# Patient Record
Sex: Female | Born: 1975 | Race: White | Hispanic: No | Marital: Married
Health system: Southern US, Community
[De-identification: ages and names within clinical notes are randomized; demographics above are authoritative.]

## PROBLEM LIST (undated history)

## (undated) DIAGNOSIS — I10 Essential (primary) hypertension: Secondary | ICD-10-CM

## (undated) DIAGNOSIS — E7800 Pure hypercholesterolemia, unspecified: Secondary | ICD-10-CM

## (undated) DIAGNOSIS — IMO0002 Reserved for concepts with insufficient information to code with codable children: Secondary | ICD-10-CM

## (undated) HISTORY — PX: BREAST EXCISIONAL BIOPSY: SUR124

---

## 2004-12-05 ENCOUNTER — Ambulatory Visit: Payer: Self-pay | Admitting: Internal Medicine

## 2007-07-29 ENCOUNTER — Emergency Department: Payer: Self-pay | Admitting: Emergency Medicine

## 2011-01-23 HISTORY — PX: BREAST CYST EXCISION: SHX579

## 2011-03-26 ENCOUNTER — Ambulatory Visit: Payer: Self-pay | Admitting: Internal Medicine

## 2011-05-08 ENCOUNTER — Ambulatory Visit: Payer: Self-pay | Admitting: Anesthesiology

## 2011-05-10 ENCOUNTER — Ambulatory Visit: Payer: Self-pay | Admitting: Emergency Medicine

## 2011-05-10 LAB — PREGNANCY, URINE: Pregnancy Test, Urine: NEGATIVE m[IU]/mL

## 2014-05-07 ENCOUNTER — Other Ambulatory Visit: Payer: Self-pay | Admitting: Internal Medicine

## 2014-05-07 DIAGNOSIS — Z1231 Encounter for screening mammogram for malignant neoplasm of breast: Secondary | ICD-10-CM

## 2014-05-16 NOTE — Op Note (Signed)
PATIENT NAME:  Erica Golden, Damonie A MR#:  [id number] DATE OF BIRTH:  [birth year]  DATE OF PROCEDURE:  05/10/2011  PREOPERATIVE DIAGNOSIS:  Mass in the right breast.  POSTOPERATIVE DIAGNOSIS:  Mass in the right breast.  PROCEDURE: Excision of the mass from the right breast.   HISTORY: This patient was seen by me in my office because of the mass in the right breast. Mammogram really did not reveal anything very revealing, but on examination I felt a mass at about the 10 o'clock position. It felt very hard, although it could be fibrocystic disease, but I did not like this. I told her that we need to have this thing removed.   PROCEDURE: The patient was then brought to surgery. Under general anesthesia, the right breast was then prepped and draped. An incision was made over the right breast. After cutting skin and subcutaneous tissue, there were actually two masses stuck together. This was then grasped with Allis clamp. Dissection was done all around. During the dissection I found multiple areas of cystic masses and a lot of cystic fluid so it could be fibrocystic disease, but this felt very hard and could be a carcinoma or fat necrosis. After removing all that area, the wound was then closed in layers. Bleeding was stopped with the Bovie. Subcuticular closure was done with 4-0 Vicryl and Dermabond was applied on the skin. The patient tolerated the procedure well and was sent to the recovery room in satisfactory condition.    ____________________________ Alton RevereMasud S. Cecelia ByarsHashmi, MD [id number] D: 05/10/2011 08:21:48 ET T: 05/10/2011 11:43:11 ET JOB#: [id number]  cc: Rydan Gulyas S. Cecelia ByarsHashmi, MD, <Dictator> Margaretann LovelessNeelam S. Khan, MD Meryle ReadyMASUD S Aloha Bartok MD ELECTRONICALLY SIGNED 05/15/2011 8:36

## 2014-08-19 ENCOUNTER — Ambulatory Visit
Admission: RE | Admit: 2014-08-19 | Discharge: 2014-08-19 | Disposition: A | Discharge status: Home / Self Care | Payer: BLUE CROSS/BLUE SHIELD | Source: Ambulatory Visit | Attending: Internal Medicine | Admitting: Internal Medicine

## 2014-08-19 DIAGNOSIS — Z1231 Encounter for screening mammogram for malignant neoplasm of breast: Secondary | ICD-10-CM | POA: Insufficient documentation

## 2014-09-14 ENCOUNTER — Encounter: Payer: Self-pay | Admitting: *Deleted

## 2014-09-15 ENCOUNTER — Ambulatory Visit
Admission: RE | Admit: 2014-09-15 | Payer: BLUE CROSS/BLUE SHIELD | Source: Ambulatory Visit | Admitting: Unknown Physician Specialty

## 2014-09-15 ENCOUNTER — Encounter: Admission: RE | Payer: Self-pay | Source: Ambulatory Visit

## 2014-09-15 HISTORY — DX: Reserved for concepts with insufficient information to code with codable children: IMO0002

## 2014-09-15 HISTORY — DX: Pure hypercholesterolemia, unspecified: E78.00

## 2014-09-15 HISTORY — DX: Essential (primary) hypertension: I10

## 2014-09-15 SURGERY — COLONOSCOPY WITH PROPOFOL
Anesthesia: General

## 2016-01-24 ENCOUNTER — Other Ambulatory Visit: Payer: Self-pay | Admitting: Internal Medicine

## 2016-01-24 DIAGNOSIS — Z1231 Encounter for screening mammogram for malignant neoplasm of breast: Secondary | ICD-10-CM

## 2016-02-24 ENCOUNTER — Ambulatory Visit: Payer: BLUE CROSS/BLUE SHIELD

## 2016-04-18 ENCOUNTER — Ambulatory Visit
Admission: RE | Admit: 2016-04-18 | Discharge: 2016-04-18 | Disposition: A | Discharge status: Home / Self Care | Payer: BLUE CROSS/BLUE SHIELD | Source: Ambulatory Visit | Attending: Internal Medicine | Admitting: Internal Medicine

## 2016-04-18 DIAGNOSIS — Z1231 Encounter for screening mammogram for malignant neoplasm of breast: Secondary | ICD-10-CM | POA: Insufficient documentation

## 2017-06-24 ENCOUNTER — Other Ambulatory Visit: Payer: Self-pay | Admitting: Internal Medicine

## 2017-06-24 DIAGNOSIS — Z1231 Encounter for screening mammogram for malignant neoplasm of breast: Secondary | ICD-10-CM

## 2017-06-25 ENCOUNTER — Ambulatory Visit
Admission: RE | Admit: 2017-06-25 | Discharge: 2017-06-25 | Disposition: A | Discharge status: Home / Self Care | Payer: 59 | Source: Ambulatory Visit | Attending: Internal Medicine | Admitting: Internal Medicine

## 2017-06-25 DIAGNOSIS — Z1231 Encounter for screening mammogram for malignant neoplasm of breast: Secondary | ICD-10-CM | POA: Insufficient documentation

## 2018-01-16 DIAGNOSIS — R7303 Prediabetes: Secondary | ICD-10-CM | POA: Diagnosis not present

## 2018-01-16 DIAGNOSIS — I10 Essential (primary) hypertension: Secondary | ICD-10-CM | POA: Diagnosis not present

## 2018-01-16 DIAGNOSIS — E785 Hyperlipidemia, unspecified: Secondary | ICD-10-CM | POA: Diagnosis not present

## 2018-01-17 DIAGNOSIS — R7303 Prediabetes: Secondary | ICD-10-CM | POA: Diagnosis not present

## 2018-01-17 DIAGNOSIS — E785 Hyperlipidemia, unspecified: Secondary | ICD-10-CM | POA: Diagnosis not present

## 2018-01-17 DIAGNOSIS — N390 Urinary tract infection, site not specified: Secondary | ICD-10-CM | POA: Diagnosis not present

## 2018-01-17 DIAGNOSIS — I10 Essential (primary) hypertension: Secondary | ICD-10-CM | POA: Diagnosis not present

## 2018-02-10 DIAGNOSIS — J329 Chronic sinusitis, unspecified: Secondary | ICD-10-CM | POA: Diagnosis not present

## 2018-02-10 DIAGNOSIS — N390 Urinary tract infection, site not specified: Secondary | ICD-10-CM | POA: Diagnosis not present

## 2018-07-12 DIAGNOSIS — J209 Acute bronchitis, unspecified: Secondary | ICD-10-CM | POA: Diagnosis not present

## 2018-07-16 DIAGNOSIS — E785 Hyperlipidemia, unspecified: Secondary | ICD-10-CM | POA: Diagnosis not present

## 2018-07-16 DIAGNOSIS — I10 Essential (primary) hypertension: Secondary | ICD-10-CM | POA: Diagnosis not present

## 2018-07-16 DIAGNOSIS — R7303 Prediabetes: Secondary | ICD-10-CM | POA: Diagnosis not present

## 2018-07-18 DIAGNOSIS — E559 Vitamin D deficiency, unspecified: Secondary | ICD-10-CM | POA: Diagnosis not present

## 2018-07-18 DIAGNOSIS — I10 Essential (primary) hypertension: Secondary | ICD-10-CM | POA: Diagnosis not present

## 2018-07-18 DIAGNOSIS — R7303 Prediabetes: Secondary | ICD-10-CM | POA: Diagnosis not present

## 2018-07-18 DIAGNOSIS — J069 Acute upper respiratory infection, unspecified: Secondary | ICD-10-CM | POA: Diagnosis not present

## 2018-07-18 DIAGNOSIS — R5383 Other fatigue: Secondary | ICD-10-CM | POA: Diagnosis not present

## 2018-07-18 DIAGNOSIS — E669 Obesity, unspecified: Secondary | ICD-10-CM | POA: Diagnosis not present

## 2018-07-18 DIAGNOSIS — E785 Hyperlipidemia, unspecified: Secondary | ICD-10-CM | POA: Diagnosis not present

## 2018-07-21 ENCOUNTER — Other Ambulatory Visit: Payer: Self-pay | Admitting: Nurse Practitioner

## 2018-07-21 DIAGNOSIS — Z1231 Encounter for screening mammogram for malignant neoplasm of breast: Secondary | ICD-10-CM

## 2018-08-01 DIAGNOSIS — R350 Frequency of micturition: Secondary | ICD-10-CM | POA: Diagnosis not present

## 2018-08-01 DIAGNOSIS — M545 Low back pain: Secondary | ICD-10-CM | POA: Diagnosis not present

## 2018-08-01 DIAGNOSIS — R0789 Other chest pain: Secondary | ICD-10-CM | POA: Diagnosis not present

## 2018-08-01 DIAGNOSIS — R69 Illness, unspecified: Secondary | ICD-10-CM | POA: Diagnosis not present

## 2018-08-01 DIAGNOSIS — Z87898 Personal history of other specified conditions: Secondary | ICD-10-CM | POA: Diagnosis not present

## 2018-09-16 ENCOUNTER — Other Ambulatory Visit: Payer: Self-pay

## 2018-09-16 ENCOUNTER — Ambulatory Visit
Admission: RE | Admit: 2018-09-16 | Discharge: 2018-09-16 | Disposition: A | Discharge status: Home / Self Care | Payer: 59 | Source: Ambulatory Visit | Attending: Nurse Practitioner | Admitting: Nurse Practitioner

## 2018-09-16 DIAGNOSIS — Z1231 Encounter for screening mammogram for malignant neoplasm of breast: Secondary | ICD-10-CM | POA: Insufficient documentation

## 2020-04-27 ENCOUNTER — Other Ambulatory Visit: Payer: Self-pay | Admitting: Nurse Practitioner

## 2020-04-27 DIAGNOSIS — Z1231 Encounter for screening mammogram for malignant neoplasm of breast: Secondary | ICD-10-CM

## 2020-05-18 ENCOUNTER — Other Ambulatory Visit: Payer: Self-pay

## 2020-05-18 ENCOUNTER — Ambulatory Visit
Admission: RE | Admit: 2020-05-18 | Discharge: 2020-05-18 | Disposition: A | Discharge status: Home / Self Care | Payer: BLUE CROSS/BLUE SHIELD | Source: Ambulatory Visit | Attending: Nurse Practitioner | Admitting: Nurse Practitioner

## 2020-05-18 DIAGNOSIS — Z1231 Encounter for screening mammogram for malignant neoplasm of breast: Secondary | ICD-10-CM | POA: Insufficient documentation

## 2020-06-02 ENCOUNTER — Other Ambulatory Visit: Payer: Self-pay

## 2020-06-02 ENCOUNTER — Other Ambulatory Visit: Payer: Self-pay | Admitting: Nurse Practitioner

## 2020-06-02 ENCOUNTER — Ambulatory Visit
Admission: RE | Admit: 2020-06-02 | Discharge: 2020-06-02 | Disposition: A | Discharge status: Home / Self Care | Payer: BLUE CROSS/BLUE SHIELD | Source: Ambulatory Visit | Attending: Nurse Practitioner | Admitting: Nurse Practitioner

## 2020-06-02 ENCOUNTER — Other Ambulatory Visit: Payer: Self-pay | Admitting: Physician Assistant

## 2020-06-02 DIAGNOSIS — Y929 Unspecified place or not applicable: Secondary | ICD-10-CM | POA: Insufficient documentation

## 2020-06-02 DIAGNOSIS — M79604 Pain in right leg: Secondary | ICD-10-CM | POA: Diagnosis not present

## 2020-06-02 DIAGNOSIS — Y939 Activity, unspecified: Secondary | ICD-10-CM | POA: Insufficient documentation

## 2020-06-02 DIAGNOSIS — R609 Edema, unspecified: Secondary | ICD-10-CM | POA: Insufficient documentation

## 2020-07-07 ENCOUNTER — Other Ambulatory Visit: Payer: Self-pay | Admitting: Nurse Practitioner

## 2020-07-08 ENCOUNTER — Other Ambulatory Visit: Payer: Self-pay | Admitting: Nurse Practitioner

## 2020-07-08 DIAGNOSIS — K8689 Other specified diseases of pancreas: Secondary | ICD-10-CM

## 2022-04-18 ENCOUNTER — Other Ambulatory Visit: Payer: Self-pay | Admitting: Nurse Practitioner

## 2022-08-17 IMAGING — MG MM DIGITAL SCREENING BILAT W/ TOMO AND CAD
6 of 12 series · 6 of 36 positions shown · non-contrast
Comparison: Previous exam(s).

CLINICAL DATA: Screening.

EXAM:
DIGITAL SCREENING BILATERAL MAMMOGRAM WITH TOMOSYNTHESIS AND CAD
TECHNIQUE: Bilateral screening digital craniocaudal and mediolateral oblique
mammograms were obtained. Bilateral screening digital breast
tomosynthesis was performed. The images were evaluated with
computer-aided detection.

[R CC synth-2D (1 of 2)]
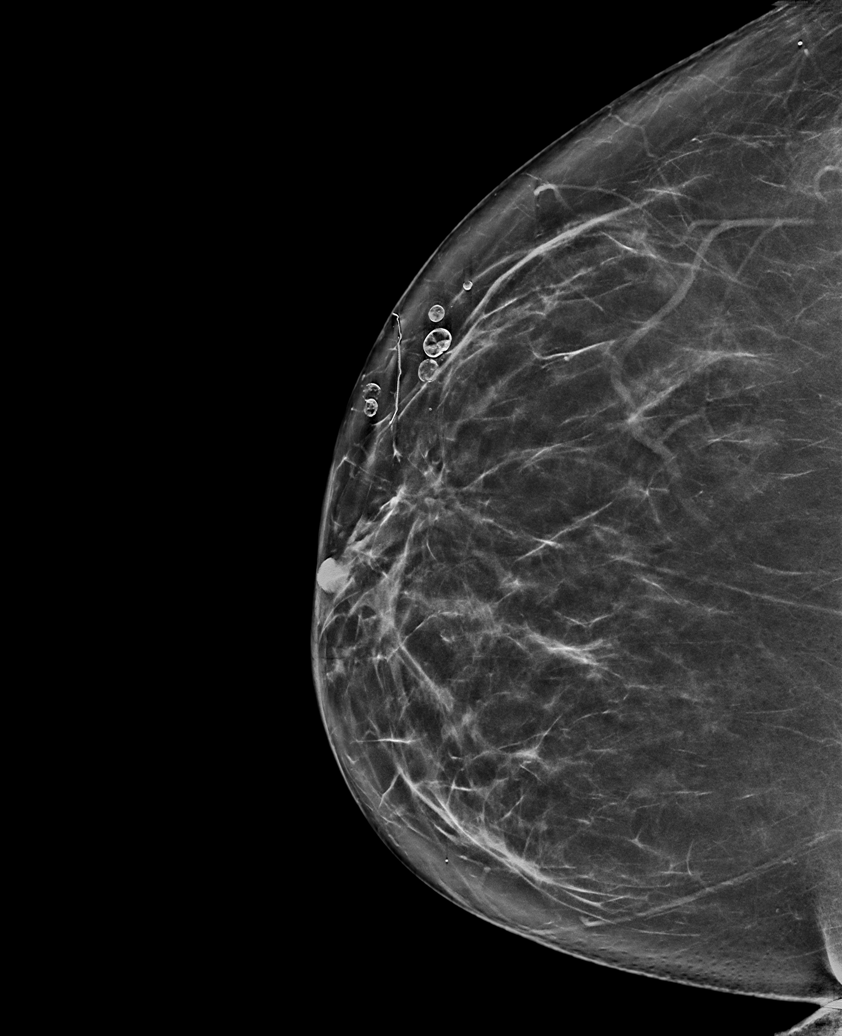

[R MLO synth-2D]
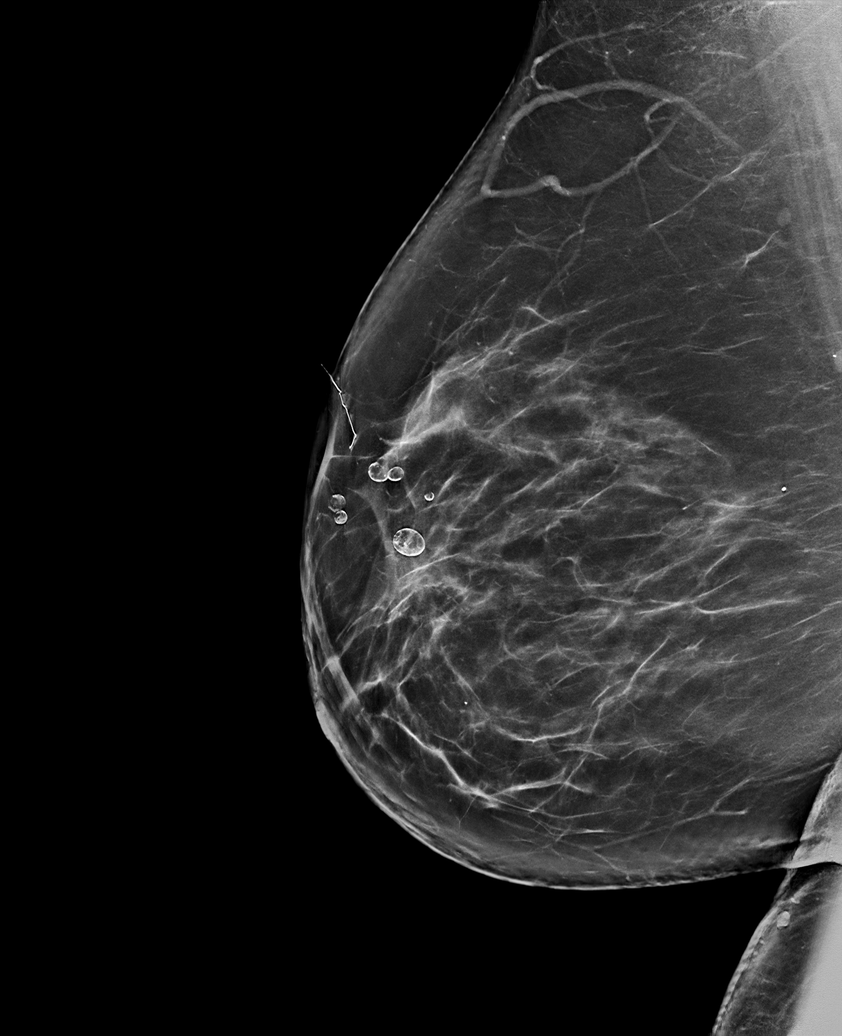

[R CC synth-2D (2 of 2)]
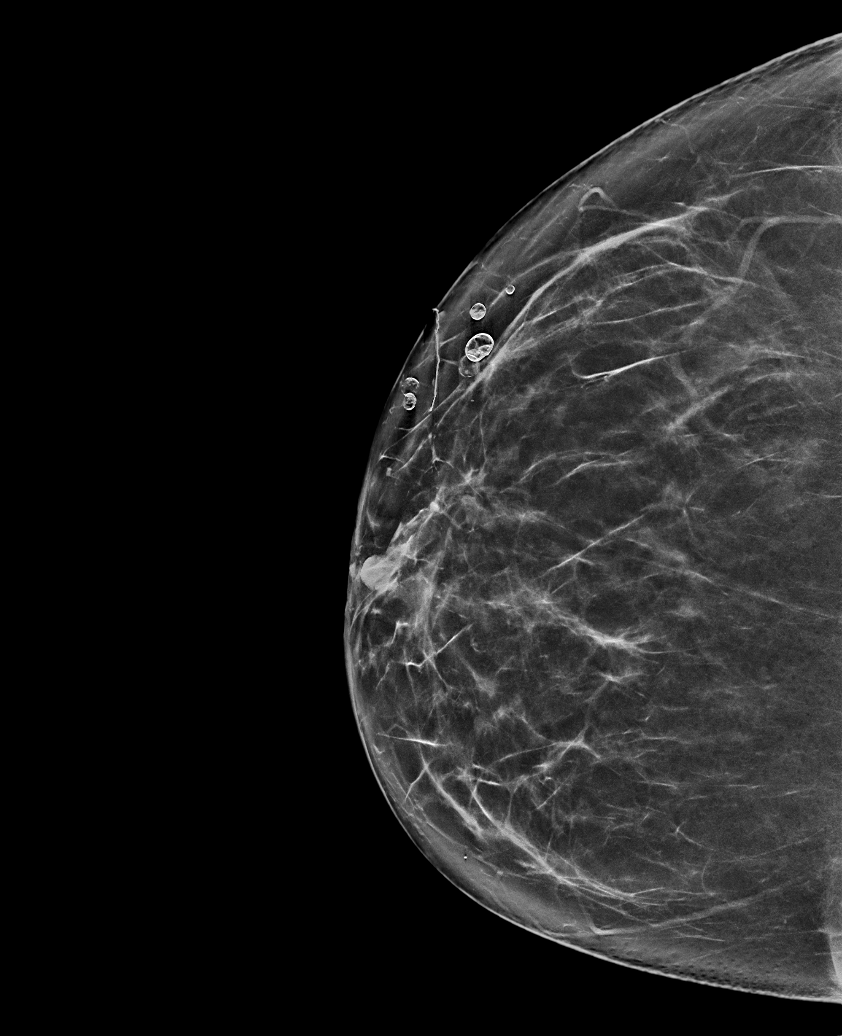

[L MLO synth-2D]
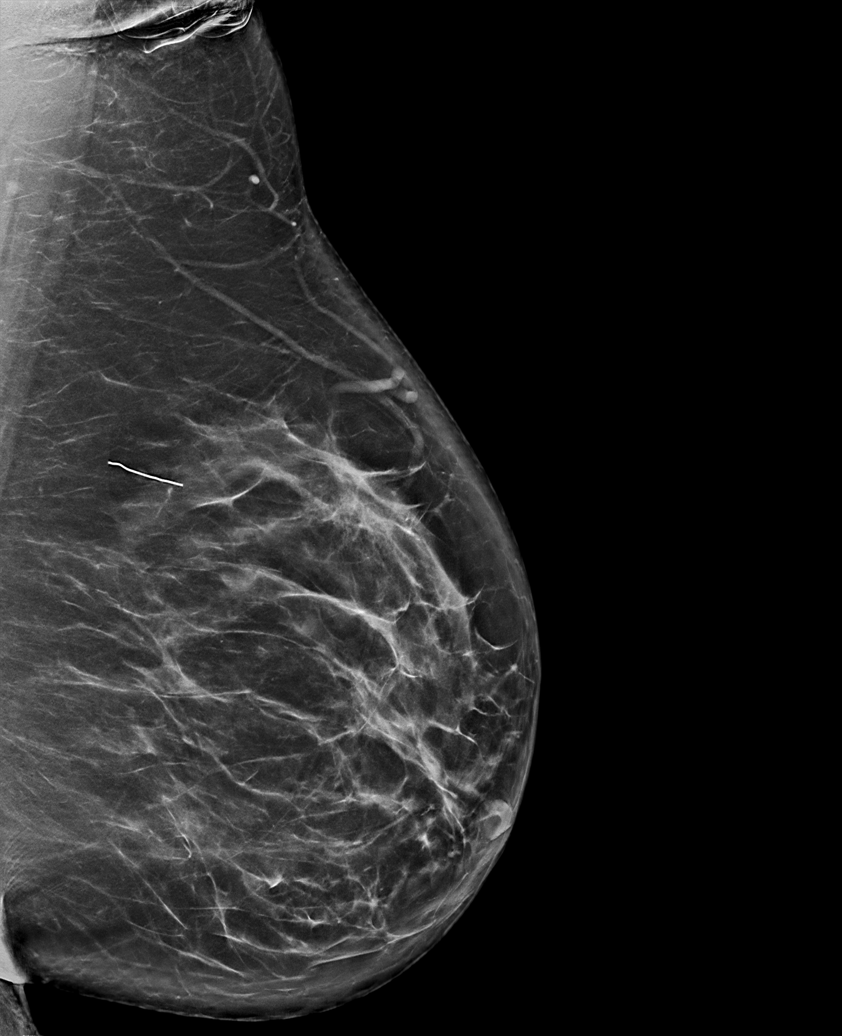

[L CC synth-2D (1 of 2)]
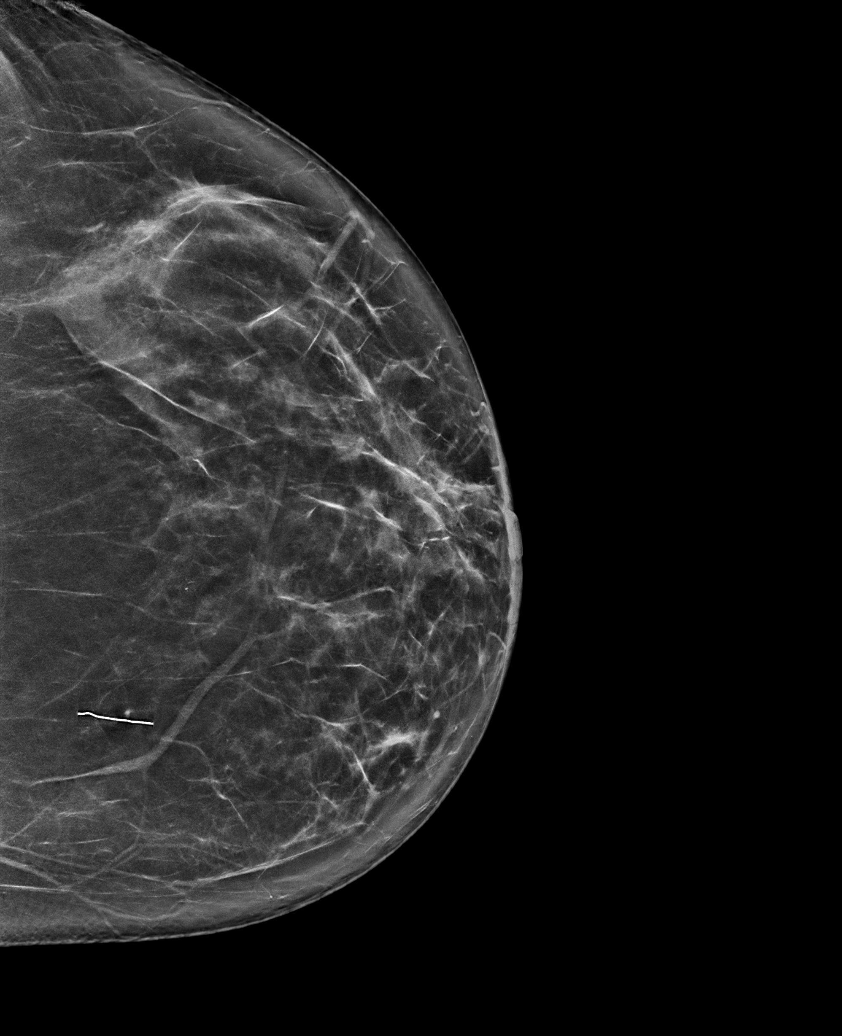

[L CC synth-2D (2 of 2)]
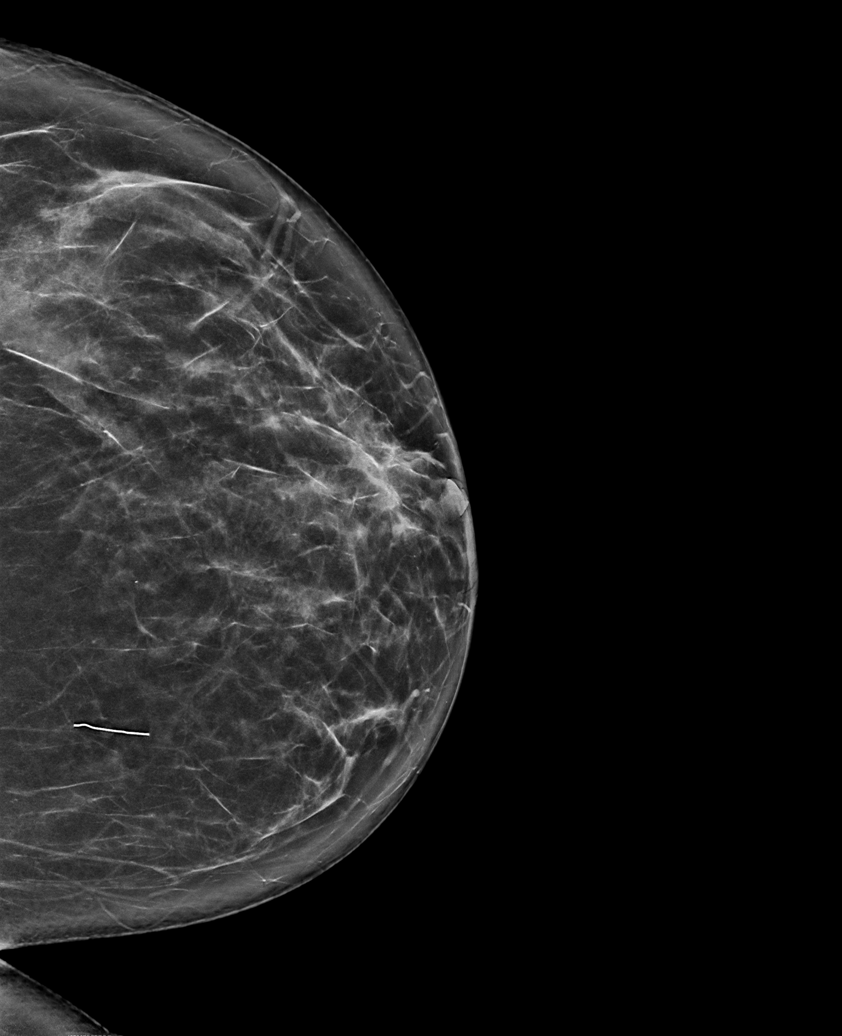

[6 of 36 positions shown; findings below may reference images not displayed]

ACR Breast Density Category b: There are scattered areas of
fibroglandular density.
FINDINGS: There are no findings suspicious for malignancy. The images were
evaluated with computer-aided detection.
IMPRESSION: No mammographic evidence of malignancy. A result letter of this
screening mammogram will be mailed directly to the patient.

RECOMMENDATION:
Screening mammogram in one year. (Code:WJ-I-BG6)

BI-RADS CATEGORY  1: Negative.

## 2023-06-03 ENCOUNTER — Other Ambulatory Visit: Payer: Self-pay
